# Patient Record
Sex: Male | Born: 1995 | Race: Black or African American | Hispanic: No | Marital: Single | Smoking: Never smoker
Health system: Southern US, Community
[De-identification: ages and names within clinical notes are randomized; demographics above are authoritative.]

## PROBLEM LIST (undated history)

## (undated) DIAGNOSIS — F909 Attention-deficit hyperactivity disorder, unspecified type: Secondary | ICD-10-CM

## (undated) DIAGNOSIS — R251 Tremor, unspecified: Secondary | ICD-10-CM

---
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
## 2017-10-03 ENCOUNTER — Encounter (HOSPITAL_COMMUNITY): Payer: Self-pay | Admitting: Emergency Medicine

## 2017-10-03 ENCOUNTER — Emergency Department (HOSPITAL_COMMUNITY)
Admission: EM | Admit: 2017-10-03 | Discharge: 2017-10-04 | Disposition: A | Discharge status: Home / Self Care | Payer: Self-pay | Attending: Emergency Medicine | Admitting: Emergency Medicine

## 2017-10-03 DIAGNOSIS — L0291 Cutaneous abscess, unspecified: Secondary | ICD-10-CM

## 2017-10-03 DIAGNOSIS — Z79899 Other long term (current) drug therapy: Secondary | ICD-10-CM | POA: Insufficient documentation

## 2017-10-03 DIAGNOSIS — F909 Attention-deficit hyperactivity disorder, unspecified type: Secondary | ICD-10-CM | POA: Insufficient documentation

## 2017-10-03 DIAGNOSIS — L02413 Cutaneous abscess of right upper limb: Secondary | ICD-10-CM | POA: Insufficient documentation

## 2017-10-03 MED ORDER — LIDOCAINE-EPINEPHRINE (PF) 2 %-1:200000 IJ SOLN
10.0000 mL | Freq: Once | INTRAMUSCULAR | Status: AC
  Administered 2017-10-04: 10 mL
  Filled 2017-10-03: qty 20

## 2017-10-03 NOTE — ED Triage Notes (Signed)
Pt reports abscess to R AC x2-3 days, reports he had a bump there that he popped and had yellow drainage. Denies fevers. Denies injecting meds

## 2017-10-03 NOTE — ED Provider Notes (Signed)
[facility] EM[facility]GENC[facility] Provider Note   CSN: [id number] Arrival date & time: 10/03/17  2148     History   Chief Complaint Chief Complaint  Patient presents with  . Abscess    HPI Franchot GalloMalik A Bulls is a 22 y.o. male who presents to ED for evaluation of [facility] that he noticed 3 days ago.  States that he had a bump there.  His stepmother told him to get a sterilized needle and poke it which he did.  States that he started having yellow drainage from it.  States that he took out a clump of some "white stuff" prior to his arrival here.  Denies any fever.  Denies any IV drug use.  Denies any prior history of similar symptoms.  Denies any trauma to the area.  HPI  Past Medical History:  Diagnosis Date  . ADHD (attention deficit hyperactivity disorder)   . Tremors of nervous system    Mom also has tremors-neurologist thinks this is hereditary    Patient Active Problem List   Diagnosis Date Noted  . NOCTURNAL ENURESIS 04/16/2007  . ECZEMA, ATOPIC DERMATITIS 06/05/2006    Past Surgical History:  Procedure Laterality Date  . CYSTOGRAM N/A 07/03/2012   Procedure: CYSTOGRAM;  Surgeon: Ky BarbanMohammad I Javaid, MD;  Location: [facility];  Service: Urology;  Laterality: N/A;  . CYSTOSCOPY N/A 07/03/2012   Procedure: CYSTOSCOPY;  Surgeon: Ky BarbanMohammad I Javaid, MD;  Location: [facility];  Service: Urology;  Laterality: N/A;        Home Medications    Prior to Admission medications   Medication Sig Start Date End Date Taking? Authorizing Provider  divalproex (DEPAKOTE ER) 250 MG 24 hr tablet Take 250 mg by mouth daily.     [provider]  EPINEPHrine (EPI-PEN) 0.3 mg/0.3 mL SOAJ injection Inject 0.3 mLs (0.3 mg total) into the muscle once. 11/26/12   Donita BrooksPickard, Warren T, MD  EPINEPHrine (EPIPEN 2-PAK) 0.3 mg/0.3 mL DEVI Per manufacture instructions for anaphylaxis     [provider]  FLUoxetine (PROZAC) 20 MG capsule Take 20 mg by mouth daily.     [provider]  GuanFACINE HCl (INTUNIV) 4 MG TB24 Take 1 tablet by mouth every morning.    [provider]  methylphenidate (CONCERTA) 18 MG CR tablet Take 18 mg by mouth daily.    [provider]  sulfamethoxazole-trimethoprim (BACTRIM DS,SEPTRA DS) 800-160 MG tablet Take 1 tablet by mouth 2 (two) times daily for 7 days. 10/04/17 10/11/17  Dietrich PatesKhatri, Rahn Lacuesta, PA-C    Family History Family History  Adopted: Yes    Social History Social History   Tobacco Use  . Smoking status: Never Smoker  . Smokeless tobacco: Never Used  Substance Use Topics  . Alcohol use: No  . Drug use: No     Allergies   Bee venom   Review of Systems Review of Systems  Constitutional: Negative for chills and fever.  Skin: Positive for color change.  Neurological: Negative for weakness and numbness.     Physical Exam Updated Vital Signs BP 121/67 (BP Location: Right Arm)   Pulse (!) 58   Temp 97.9 F (36.6 C) (Oral)   Resp 17   Ht 5\' 7"  (1.702 m)   Wt 76.2 kg (168 lb)   SpO2 100%   BMI 26.31 kg/m   Physical Exam  Constitutional: He appears well-developed and well-nourished. No distress.  HENT:  Head: Normocephalic and atraumatic.  Eyes: Conjunctivae and  EOM are normal. No scleral icterus.  Neck: Normal range of motion.  Pulmonary/Chest: Effort normal. No respiratory distress.  Neurological: He is alert.  Skin: No rash noted. He is not diaphoretic.  3 cm abscess with induration and fluctuance noted above the right AC.  There is active purulent drainage noted. No overlying erythema or streaking noted.  Psychiatric: He has a normal mood and affect.  Nursing note and vitals reviewed.    ED Treatments / Results  Labs (all labs ordered are listed, but only abnormal results are displayed) Labs Reviewed - No data to display  EKG None  Radiology No results found.  Procedures .Marland KitchenIncision and Drainage Date/Time: 10/04/2017 12:37 AM Performed by: Dietrich Pates,  PA-C Authorized by: Dietrich Pates, PA-C   Consent:    Consent obtained:  Verbal   Consent given by:  Patient   Risks discussed:  Bleeding, damage to other organs, incomplete drainage, infection and pain Location:    Type:  Abscess   Location:  Upper extremity   Upper extremity location:  Arm   Arm location:  R upper arm Pre-procedure details:    Skin preparation:  Betadine Anesthesia (see MAR for exact dosages):    Anesthesia method:  Local infiltration   Local anesthetic:  Lidocaine 2% WITH epi Procedure details:    Needle aspiration: no     Incision types:  Stab incision   Wound management:  Probed and deloculated and irrigated with saline   Drainage:  Purulent   Drainage amount:  Moderate   Packing materials:  None Post-procedure details:    Patient tolerance of procedure:  Tolerated well, no immediate complications   (including critical care time)  Medications Ordered in ED Medications  lidocaine-EPINEPHrine (XYLOCAINE W/EPI) 2 %-1:200000 (PF) injection 10 mL (10 mLs Infiltration Given by Other 10/04/17 0028)     Initial Impression / Assessment and Plan / ED Course  I have reviewed the triage vital signs and the nursing notes.  Pertinent labs & imaging results that were available during my care of the patient were reviewed by me and considered in my medical decision making (see chart for details).      Patient with skin abscess. Incision and drainage performed in the ED today with purulent drainage noted.  Abscess was not large enough to warrant packing or drain placement. No signs of systemic illness present. Advised patient to return for wound recheck in 2 days, sooner if signs of infection or increased bleeding/drainage noted. Supportive care and return precautions discussed.  Pt sent home with Bactrim. The patient appears reasonably screened and/or stabilized for discharge. Strict return precautions given.  Portions of this note were generated with Herbalist. Dictation errors may occur despite best attempts at proofreading.  Final Clinical Impressions(s) / ED Diagnoses   Final diagnoses:  Abscess    ED Discharge Orders        Ordered    sulfamethoxazole-trimethoprim (BACTRIM DS,SEPTRA DS) 800-160 MG tablet  2 times daily     10/04/17 0036       Dietrich Pates, PA-C 10/04/17 0038    Derwood Kaplan, MD 10/05/17 [phone]

## 2017-10-04 MED ORDER — SULFAMETHOXAZOLE-TRIMETHOPRIM 800-160 MG PO TABS: 1.0000 | ORAL_TABLET | Freq: Two times a day (BID) | ORAL | 0 refills | Status: AC

## 2019-06-16 ENCOUNTER — Emergency Department (HOSPITAL_COMMUNITY): Payer: Self-pay

## 2019-06-16 ENCOUNTER — Encounter (HOSPITAL_COMMUNITY): Payer: Self-pay | Admitting: Emergency Medicine

## 2019-06-16 ENCOUNTER — Other Ambulatory Visit: Payer: Self-pay

## 2019-06-16 ENCOUNTER — Emergency Department (HOSPITAL_COMMUNITY)
Admission: EM | Admit: 2019-06-16 | Discharge: 2019-06-16 | Disposition: A | Discharge status: Home / Self Care | Payer: Self-pay | Attending: Emergency Medicine | Admitting: Emergency Medicine

## 2019-06-16 DIAGNOSIS — Y929 Unspecified place or not applicable: Secondary | ICD-10-CM | POA: Insufficient documentation

## 2019-06-16 DIAGNOSIS — S63502A Unspecified sprain of left wrist, initial encounter: Secondary | ICD-10-CM | POA: Insufficient documentation

## 2019-06-16 DIAGNOSIS — Y9355 Activity, bike riding: Secondary | ICD-10-CM | POA: Insufficient documentation

## 2019-06-16 DIAGNOSIS — F909 Attention-deficit hyperactivity disorder, unspecified type: Secondary | ICD-10-CM | POA: Insufficient documentation

## 2019-06-16 DIAGNOSIS — Y999 Unspecified external cause status: Secondary | ICD-10-CM | POA: Insufficient documentation

## 2019-06-16 DIAGNOSIS — Z79899 Other long term (current) drug therapy: Secondary | ICD-10-CM | POA: Insufficient documentation
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
## 2019-06-16 NOTE — Discharge Instructions (Addendum)
Wear the brace as directed. You can take Tylenol or ibuprofen as needed for pain. Return to the ER if you start to experience additional injuries, worsening pain or swelling, numbness in your hands.
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
## 2020-02-29 ENCOUNTER — Emergency Department (HOSPITAL_COMMUNITY)
Admission: EM | Admit: 2020-02-29 | Discharge: 2020-02-29 | Disposition: A | Discharge status: Home / Self Care | Payer: Self-pay | Attending: Emergency Medicine | Admitting: Emergency Medicine

## 2020-02-29 ENCOUNTER — Other Ambulatory Visit: Payer: Self-pay

## 2020-02-29 ENCOUNTER — Encounter (HOSPITAL_COMMUNITY): Payer: Self-pay | Admitting: Emergency Medicine

## 2020-02-29 DIAGNOSIS — J069 Acute upper respiratory infection, unspecified: Secondary | ICD-10-CM | POA: Insufficient documentation

## 2020-02-29 DIAGNOSIS — Z20822 Contact with and (suspected) exposure to covid-19: Secondary | ICD-10-CM | POA: Insufficient documentation

## 2020-02-29 DIAGNOSIS — J029 Acute pharyngitis, unspecified: Secondary | ICD-10-CM

## 2020-02-29 LAB — GROUP A STREP BY PCR: Group A Strep by PCR: NOT DETECTED

## 2020-02-29 LAB — RESPIRATORY PANEL BY RT PCR (FLU A&B, COVID)
Influenza A by PCR: NEGATIVE
Influenza B by PCR: NEGATIVE
SARS Coronavirus 2 by RT PCR: NEGATIVE

## 2020-02-29 NOTE — ED Provider Notes (Signed)
[facility] EMERGENCY DEPARTMENT Provider Note   CSN: [id number] Arrival date & time: 02/29/20  1645     History Chief Complaint  Patient presents with  . Sore Throat  . Nasal Congestion    Norman Ballard is a 24 y.o. male.  HPI      24yo male presents with concern for sore throat, nasal congestion for 2 days. Reports had mild cough but no significant cough today. Denies fevers/chills. No known sick contacts. Has not been vaccinated against COVID 19. Denies n/v/abdominal pain/diarrhea/loss of taste or smell. Did have itching of right eye, has been rubbing it, no pain, no drainage, no visual changes   Past Medical History:  Diagnosis Date  . ADHD (attention deficit hyperactivity disorder)   . Tremors of nervous system    Mom also has tremors-neurologist thinks this is hereditary    Patient Active Problem List   Diagnosis Date Noted  . NOCTURNAL ENURESIS 04/16/2007  . ECZEMA, ATOPIC DERMATITIS 06/05/2006    Past Surgical History:  Procedure Laterality Date  . CYSTOGRAM N/A 07/03/2012   Procedure: CYSTOGRAM;  Surgeon: Ky Barban, MD;  Location: [facility];  Service: Urology;  Laterality: N/A;  . CYSTOSCOPY N/A 07/03/2012   Procedure: CYSTOSCOPY;  Surgeon: Ky Barban, MD;  Location: [facility];  Service: Urology;  Laterality: N/A;       Family History  Adopted: Yes    Social History   Tobacco Use  . Smoking status: Never Smoker  . Smokeless tobacco: Never Used  Substance Use Topics  . Alcohol use: No  . Drug use: No    Home Medications Prior to Admission medications   Medication Sig Start Date End Date Taking? Authorizing Provider  divalproex (DEPAKOTE ER) 250 MG 24 hr tablet Take 250 mg by mouth daily.     [provider]  EPINEPHrine (EPI-PEN) 0.3 mg/0.3 mL SOAJ injection Inject 0.3 mLs (0.3 mg total) into the muscle once. 11/26/12   Donita Brooks, MD  EPINEPHrine (EPIPEN 2-PAK) 0.3 mg/0.3 mL DEVI Per manufacture  instructions for anaphylaxis     [provider]  FLUoxetine (PROZAC) 20 MG capsule Take 20 mg by mouth daily.    [provider]  GuanFACINE HCl (INTUNIV) 4 MG TB24 Take 1 tablet by mouth every morning.    [provider]  methylphenidate (CONCERTA) 18 MG CR tablet Take 18 mg by mouth daily.    [provider]    Allergies    Bee venom  Review of Systems   Review of Systems  Constitutional: Negative for fever.  HENT: Positive for congestion and sore throat.   Eyes: Negative for visual disturbance.  Respiratory: Positive for cough. Negative for shortness of breath.   Cardiovascular: Negative for chest pain.  Gastrointestinal: Negative for abdominal pain, diarrhea, nausea and vomiting.  Genitourinary: Negative for difficulty urinating.  Musculoskeletal: Negative for back pain and neck stiffness.  Skin: Negative for rash.  Neurological: Negative for syncope and headaches.    Physical Exam Updated Vital Signs BP 137/72   Pulse 86   Temp 98.3 F (36.8 C) (Oral)   Resp 18   SpO2 99%   Physical Exam Vitals and nursing note reviewed.  Constitutional:      General: He is not in acute distress.    Appearance: Normal appearance. He is not ill-appearing, toxic-appearing or diaphoretic.  HENT:     Head: Normocephalic.     Nose: Congestion present.     Mouth/Throat:  Mouth: No oral lesions.     Pharynx: Uvula midline. No pharyngeal swelling, oropharyngeal exudate or uvula swelling.  Eyes:     Comments: Mild conjunctival erythema right  Cardiovascular:     Rate and Rhythm: Normal rate and regular rhythm.     Pulses: Normal pulses.  Pulmonary:     Effort: Pulmonary effort is normal. No respiratory distress.  Musculoskeletal:        General: No deformity or signs of injury.     Cervical back: No rigidity.  Skin:    General: Skin is warm and dry.     Coloration: Skin is not jaundiced or pale.  Neurological:     General: No focal deficit  present.     Mental Status: He is alert and oriented to person, place, and time.     ED Results / Procedures / Treatments   Labs (all labs ordered are listed, but only abnormal results are displayed) Labs Reviewed  GROUP A STREP BY PCR  RESPIRATORY PANEL BY RT PCR (FLU A&B, COVID)    EKG None  Radiology No results found.  Procedures Procedures (including critical care time)  Medications Ordered in ED Medications - No data to display  ED Course  I have reviewed the triage vital signs and the nursing notes.  Pertinent labs & imaging results that were available during my care of the patient were reviewed by me and considered in my medical decision making (see chart for details).    MDM Rules/Calculators/A&P                           24yo male presents with concern for sore throat, nasal congestion for 2 days.  Strep testing negative.  No sign of PTA/RPA. Duration of symptoms not consistent with bacterial sinusitis. Doubt pneumonia with no significant cough, no dyspnea, no hypoxia. No signs of bacterial conjunctivitis.  Suspect viral URI, possible viral conjunctivitis. No significant pain to suggest corneal abrasion.   Recommend continued supportive care. COVID 19 testing pending at discharge. Patient discharged in stable condition with understanding of reasons to return.    Final Clinical Impression(s) / ED Diagnoses Final diagnoses:  Viral URI  Pharyngitis, unspecified etiology  COVID-19 virus test result unknown    Rx / DC Orders ED Discharge Orders    None       Alvira Monday, MD 03/01/20 1020

## 2020-02-29 NOTE — Discharge Instructions (Addendum)
Happy belated birthday! It was a pleasure caring for you today.  Your symptoms are consistent with a viral upper respiratory infection including a viral pharyngitis and possible conjunctivitis.  We recommend continued supportive care. Your COVID test is pending. Please quarantine until the results return and if positive, continue to quarantine for 10 days.  If you do not have COVID 19 now, I strongly encourage you to obtain the COVID 19 vaccine.

## 2020-02-29 NOTE — ED Triage Notes (Signed)
Reports 2 days of cold symptoms which include sore throat, nasal congestion, and a mild cough. Denies fevers or chills.
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
## 2023-10-12 ENCOUNTER — Emergency Department (HOSPITAL_COMMUNITY)
Admission: EM | Admit: 2023-10-12 | Discharge: 2023-10-12 | Disposition: A | Discharge status: Home / Self Care | Attending: Student | Admitting: Student

## 2023-10-12 DIAGNOSIS — Y92410 Unspecified street and highway as the place of occurrence of the external cause: Secondary | ICD-10-CM | POA: Insufficient documentation

## 2023-10-12 DIAGNOSIS — S50311A Abrasion of right elbow, initial encounter: Secondary | ICD-10-CM | POA: Diagnosis present

## 2023-10-12 DIAGNOSIS — Y9355 Activity, bike riding: Secondary | ICD-10-CM | POA: Insufficient documentation

## 2023-10-12 DIAGNOSIS — T148XXA Other injury of unspecified body region, initial encounter: Secondary | ICD-10-CM
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
## 2023-10-12 NOTE — Discharge Instructions (Signed)
 You were seen in the ER today for your elbow injury.  Your physical exam was very reassuring.  Please keep the area clean and dry, keep it covered when you are at work and return to the ER with any severe symptoms.

## 2024-02-07 DEATH — deceased
